# Patient Record
Sex: Female | Born: 1955 | Race: White | Hispanic: No | Marital: Married | Smoking: Never smoker
Health system: Southern US, Community
[De-identification: ages and names within clinical notes are randomized; demographics above are authoritative.]

## PROBLEM LIST (undated history)

## (undated) DIAGNOSIS — N3281 Overactive bladder: Secondary | ICD-10-CM

## (undated) DIAGNOSIS — K862 Cyst of pancreas: Secondary | ICD-10-CM

## (undated) DIAGNOSIS — D239 Other benign neoplasm of skin, unspecified: Secondary | ICD-10-CM

## (undated) DIAGNOSIS — E785 Hyperlipidemia, unspecified: Secondary | ICD-10-CM

## (undated) DIAGNOSIS — L509 Urticaria, unspecified: Secondary | ICD-10-CM

## (undated) DIAGNOSIS — M8580 Other specified disorders of bone density and structure, unspecified site: Secondary | ICD-10-CM

## (undated) DIAGNOSIS — K219 Gastro-esophageal reflux disease without esophagitis: Secondary | ICD-10-CM

## (undated) DIAGNOSIS — R739 Hyperglycemia, unspecified: Secondary | ICD-10-CM

## (undated) DIAGNOSIS — L719 Rosacea, unspecified: Secondary | ICD-10-CM

## (undated) HISTORY — DX: Gastro-esophageal reflux disease without esophagitis: K21.9

## (undated) HISTORY — DX: Hyperglycemia, unspecified: R73.9

## (undated) HISTORY — DX: Rosacea, unspecified: L71.9

## (undated) HISTORY — DX: Other benign neoplasm of skin, unspecified: D23.9

## (undated) HISTORY — PX: OTHER SURGICAL HISTORY: SHX169

## (undated) HISTORY — DX: Urticaria, unspecified: L50.9

## (undated) HISTORY — DX: Hyperlipidemia, unspecified: E78.5

## (undated) HISTORY — DX: Overactive bladder: N32.81

## (undated) HISTORY — DX: Other specified disorders of bone density and structure, unspecified site: M85.80

## (undated) HISTORY — PX: THYROID SURGERY: SHX805

## (undated) HISTORY — DX: Cyst of pancreas: K86.2

---

## 2019-03-14 ENCOUNTER — Ambulatory Visit (INDEPENDENT_AMBULATORY_CARE_PROVIDER_SITE_OTHER): Payer: Medicare Other | Admitting: Gastroenterology

## 2019-03-14 VITALS — BP 108/80 | HR 84 | Temp 98.6°F | Ht 65.0 in | Wt 140.1 lb

## 2019-03-14 DIAGNOSIS — K862 Cyst of pancreas: Secondary | ICD-10-CM

## 2019-03-14 NOTE — Progress Notes (Signed)
HPI :  64 year old female with a history of GERD, hyperlipidemia, pancreatic cyst, referred here by Margaretha Sheffield MD for pancreatic cyst.  The patient underwent an ultrasound of her abdomen for aortic aneurysm screening on November 30.  There was no evidence of an aneurysm however there was a short segment of echogenicity within the lower abdominal aorta with lack of color Doppler signal and was concerning for possible thrombus.  This led to CT angiogram of the abdomen and pelvis on January 25 2019 in [place].  As outlined below, the CT showed that along the deep portion of the uncinate process of the pancreas, a 1.6, by 2.3 x 1.6 cm low attenuating nodular focus was appreciated with a Hounsfield unit of 11.86.  This was suspected to represent a cyst.  The pancreas was otherwise unremarkable without any lymphadenopathy or other concerning findings.  Otherwise there was no evidence of an aneurysm nor partially obstructing mural thrombus.  She did have a ovarian cyst of the left adnexa and was recommended to have a follow-up ultrasound.  Patient states she is otherwise feels quite well.  She denies any history of pancreatitis or pancreas problems personally.  She denies any family history of pancreatic cancer.  Her mother had cirrhosis from history of hepatitis following a blood transfusion.  She states her sister also passed away at age 11 due to "hepatitis ".  She denies any tobacco use history.  She denies any abdominal pains.  No weight loss.  No digestive complaints otherwise.  Reflux is well controlled with Pepcid as needed.  Prior workup: CT chest / abdomen / pelvis 01/25/2019 - 1.6 x 2.3 x 1.6cm nodular focus of the uncinate process , likely represents a cyst.  [place] 01/24/19 -  short segment of echogenicity within the lower abdominal aorta with lack of color Doppler signal and was concerning for possible thrombus  Colonoscopy 09/26/2014 - tortous colon, otherwise normal, no polyps - Dr. Dawna Part   Past Medical History:  Diagnosis Date  . Dysplastic nevus of skin   . GERD (gastroesophageal reflux disease)   . Hyperglycemia   . Hyperlipidemia   . Rosacea   . Urticaria      Past Surgical History:  Procedure Laterality Date  . bartholin cyst removal    . THYROID SURGERY     glossal duct cyst   Family History  Problem Relation Age of Onset  . Cirrhosis Mother   . Hypertension Mother   . Diabetes type II Mother   . Hypothyroidism Mother   . Myasthenia gravis Mother   . Heart attack Father   . Cirrhosis Sister        hepatitis  . Colon cancer Neg Hx   . Liver cancer Neg Hx   . Rectal cancer Neg Hx   . Stomach cancer Neg Hx    Social History   Tobacco Use  . Smoking status: Never Smoker  . Smokeless tobacco: Never Used  Substance Use Topics  . Alcohol use: Yes    Alcohol/week: 1.0 standard drinks    Types: 1 Cans of beer per week    Comment: 1 beer  weekly  . Drug use: Never   Current Outpatient Medications  Medication Sig Dispense Refill  . aspirin EC 81 MG tablet Take 81 mg by mouth daily.    Marland Kitchen atorvastatin (LIPITOR) 20 MG tablet Take 20 mg by mouth daily.    . Cholecalciferol (VITAMIN D) 50 MCG (2000 UT) tablet Take 2,000 Units  by mouth daily.    . folic acid (FOLVITE) Q000111Q MCG tablet Take 400 mcg by mouth daily.    . vitamin B-12 (CYANOCOBALAMIN) 500 MCG tablet Take 500 mcg by mouth every other day.     . famotidine (PEPCID) 20 MG tablet Take 20 mg by mouth as needed.      No current facility-administered medications for this visit.   Allergies  Allergen Reactions  . Penicillins Rash     Review of Systems: All systems reviewed and negative except where noted in HPI.    No results found.  Physical Exam: BP 108/80   Pulse 84   Temp 98.6 F (37 C)   Ht 5\' 5"  (1.651 m)   Wt 140 lb 2 oz (63.6 kg)   BMI 23.32 kg/m  Constitutional: Pleasant,well-developed, female in no acute distress. HEENT: Normocephalic and atraumatic.  Conjunctivae are normal. No scleral icterus. Neck supple.  Cardiovascular: Normal rate, regular rhythm.  Pulmonary/chest: Effort normal and breath sounds normal. No wheezing, rales or rhonchi. Abdominal: Soft, nondistended, nontender. . There are no masses palpable. No hepatomegaly. Extremities: no edema Lymphadenopathy: No cervical adenopathy noted. Neurological: Alert and oriented to person place and time. Skin: Skin is warm and dry. No rashes noted. Psychiatric: Normal mood and affect. Behavior is normal.   ASSESSMENT AND PLAN: 64 year old female here for new patient assessment regarding the following:  Pancreatic cyst / abnormal imaging of the pancreas - incidentally noted 2.3 cm suspected cystic lesion in the uncinate process on recent CT scan.  She is asymptomatic without any family history of pancreatic disease.  We discussed the findings and differential diagnosis.  Given the size of the lesion, I think an EUS may be reasonable at this time per ACG guidelines.  I will discuss her case with my advanced endoscopy partners to determine if they want to proceed directly with EUS plus minus FNA pending findings, or if they would prefer to have MRCP to further characterize this.  I discussed what both of these entailed.  I will get back to her in the next week or so after I can discuss her case with my partners.  If she is deemed a candidate for EUS directly, she is comfortable proceeding with this in the upcoming weeks.  All questions answered, will get back to her shortly.  She agreed.  I spent 45 minutes of time, including in depth chart review, independent review of results as outlined above, communicating results with the patient directly, face-to-face time with the patient, coordinating care, ordering studies and medications as appropriate, and documenting this encounter.    Fitzgerald Cellar, MD [facility]

## 2019-03-14 NOTE — Progress Notes (Signed)
New patient paperwork, Referral from Dannial Monarch, FNP for "Cyst if pancreas"

## 2019-03-14 NOTE — Patient Instructions (Signed)
If you are age 66 or older, your body mass index should be between 23-30. Your Body mass index is 23.32 kg/m. If this is out of the aforementioned range listed, please consider follow up with your Primary Care Provider.  If you are age 30 or younger, your body mass index should be between 19-25. Your Body mass index is 23.32 kg/m. If this is out of the aformentioned range listed, please consider follow up with your Primary Care Provider.   You will be due for a recall colonoscopy in August 2026. We will send you a reminder in the mail when it gets closer to that time.  Thank you for entrusting me with your care and for choosing [facility], Dr. Gilgo Cellar

## 2019-03-15 ENCOUNTER — Other Ambulatory Visit: Payer: Self-pay

## 2019-03-15 ENCOUNTER — Telehealth: Payer: Self-pay | Admitting: Gastroenterology

## 2019-03-15 DIAGNOSIS — K862 Cyst of pancreas: Secondary | ICD-10-CM

## 2019-03-15 NOTE — Telephone Encounter (Signed)
Traci Burns - can you please let the patient know that I spoke with my advanced endoscopy colleagues about her case - EUS versus MRI. They recommend MRI / MRCP first to better evaluate this, she may not need EUS pending findings.  - can you help coordinate MRI pancreas / MRCP for her? If she is willing would prefer to do it in [place], it is much easier to review the imaging and work with our radiologists here if we have further questions. Thanks

## 2019-03-15 NOTE — Telephone Encounter (Signed)
Called patient and scheduled MRI/MRCP at [facility] on 03/25/19 patient to arrive at 12:30pm and be NPO 4 hours before

## 2019-03-25 ENCOUNTER — Other Ambulatory Visit: Payer: Self-pay

## 2019-03-25 ENCOUNTER — Ambulatory Visit (HOSPITAL_COMMUNITY)
Admission: RE | Admit: 2019-03-25 | Discharge: 2019-03-25 | Disposition: A | Discharge status: Home / Self Care | Payer: Medicare Other | Source: Ambulatory Visit | Attending: Gastroenterology | Admitting: Gastroenterology

## 2019-03-25 ENCOUNTER — Other Ambulatory Visit: Payer: Self-pay | Admitting: Gastroenterology

## 2019-03-25 DIAGNOSIS — K862 Cyst of pancreas: Secondary | ICD-10-CM | POA: Diagnosis present

## 2019-03-25 LAB — POCT I-STAT CREATININE: Creatinine, Ser: 0.7 mg/dL (ref 0.44–1.00)

## 2019-03-25 MED ORDER — GADOBUTROL 1 MMOL/ML IV SOLN
7.0000 mL | Freq: Once | INTRAVENOUS | Status: AC | PRN
  Administered 2019-03-25: 7 mL via INTRAVENOUS

## 2019-09-01 ENCOUNTER — Telehealth: Payer: Self-pay | Admitting: Gastroenterology

## 2019-09-01 ENCOUNTER — Other Ambulatory Visit: Payer: Self-pay

## 2019-09-01 DIAGNOSIS — K862 Cyst of pancreas: Secondary | ICD-10-CM

## 2019-09-01 NOTE — Telephone Encounter (Signed)
Patient notified is due for MRCP to follow up pancreatic cyst in August.  I have scheduled her for MRI at [facility] on 09/26/19 at 9:00. She will need to arrive at 8:00 for stat labs and the MRI at 9:00.  She is instructed to be NPO after midnight.

## 2019-09-01 NOTE — Telephone Encounter (Signed)
Patient called to schedule a repeat MRI

## 2019-09-26 ENCOUNTER — Other Ambulatory Visit: Payer: Self-pay | Admitting: Gastroenterology

## 2019-09-26 ENCOUNTER — Ambulatory Visit (HOSPITAL_COMMUNITY)
Admission: RE | Admit: 2019-09-26 | Discharge: 2019-09-26 | Disposition: A | Discharge status: Home / Self Care | Payer: Medicare Other | Source: Ambulatory Visit | Attending: Gastroenterology | Admitting: Gastroenterology

## 2019-09-26 ENCOUNTER — Other Ambulatory Visit: Payer: Self-pay

## 2019-09-26 DIAGNOSIS — K862 Cyst of pancreas: Secondary | ICD-10-CM

## 2019-09-26 MED ORDER — GADOBUTROL 1 MMOL/ML IV SOLN
6.0000 mL | Freq: Once | INTRAVENOUS | Status: AC | PRN
  Administered 2019-09-26: 6 mL via INTRAVENOUS

## 2020-04-11 ENCOUNTER — Ambulatory Visit (INDEPENDENT_AMBULATORY_CARE_PROVIDER_SITE_OTHER): Payer: Medicare Other | Admitting: Gastroenterology

## 2020-04-11 ENCOUNTER — Encounter: Payer: Self-pay | Admitting: Gastroenterology

## 2020-04-11 VITALS — BP 122/64 | HR 68 | Ht 65.0 in | Wt 141.0 lb

## 2020-04-11 DIAGNOSIS — K862 Cyst of pancreas: Secondary | ICD-10-CM | POA: Diagnosis not present

## 2020-04-11 DIAGNOSIS — Z1211 Encounter for screening for malignant neoplasm of colon: Secondary | ICD-10-CM | POA: Diagnosis not present

## 2020-04-11 NOTE — Patient Instructions (Signed)
If you are age 68 or older, your body mass index should be between 23-30. Your Body mass index is 23.46 kg/m. If this is out of the aforementioned range listed, please consider follow up with your Primary Care Provider.  If you are age 1 or younger, your body mass index should be between 19-25. Your Body mass index is 23.46 kg/m. If this is out of the aformentioned range listed, please consider follow up with your Primary Care Provider.  You will be contacted to schedule your MRCP at [facility].  Their phone number is [phone].    You will be due for a recall colonoscopy in 09-2024. We will send you a reminder in the mail when it gets closer to that time.  Thank you for entrusting me with your care and for choosing [facility], Dr. George Cellar

## 2020-04-11 NOTE — Progress Notes (Signed)
HPI :  65 year old female here for a follow up visit for pancreatic cyst.   The patient underwent an ultrasound of her abdomen for aortic aneurysm screening on In November 2020.  There was no evidence of an aneurysm however there was a short segment of echogenicity within the lower abdominal aorta with lack of color Doppler signal and was concerning for possible thrombus.  This led to CT angiogram of the abdomen and pelvis on January 25 2019 in [place]. The CT showed that along the deep portion of the uncinate process of the pancreas, a 1.6, by 2.3 x 1.6 cm low attenuating nodular focus was appreciated with a Hounsfield unit of 11.86.  This was suspected to represent a cyst.  The pancreas was otherwise unremarkable without any lymphadenopathy or other concerning findings.  Otherwise there was no evidence of an aneurysm nor partially obstructing mural thrombus.    Since she was seen here she has undergone 2 MRCP's over the past year.  I have discussed her case with my advanced endoscopy colleagues in the had recommended surveillance MRI over EUS with possible FNA given the appearance of the lesion on MRI and stability over time.  She continues to feel well.  She has no abdominal symptoms of bother her.  No pain.  Weight is stable.  She is eating well, no postprandial symptoms.  She again denies any family history of pancreatic cancer or pancreatitis.  She denies any tobacco use.  Prior workup: CT chest / abdomen / pelvis 01/25/2019 - 1.6 x 2.3 x 1.6cm nodular focus of the uncinate process , likely represents a cyst.  MRCP 03/25/19 -  IMPRESSION: 1. 2.7 by 0.8 by 2.4 cm serpentine cystic lesion or clustered cystic lesion along the posterior margin of the pancreatic head and uncinate process. Based on configuration the top considerations would be intraductal papillary mucinous neoplasm or serous cystadenoma. Based on the long axis size of the lesion being greater than 2.5 cm, reasonable follow up  at this point might include endoscopic ultrasound with fine-needle aspiration, or follow up pancreatic protocol MRI in 6 months time to assess for change. This recommendation follows ACR consensus guidelines: Management of Incidental Pancreatic Cysts: A White Paper of the ACR Incidental Findings Committee. [place] 2409;[id number]:[id number]. 2. Aortic Atherosclerosis (ICD10-I70.0).  MRCP 09/26/19 - IMPRESSION: 1. Multi cystic lesion in the pancreatic head is unchanged since previous imaging. Size greater than 2.5 cm, at this size would suggest another six-month follow-up to assess for stability. Differential is unchanged with serous cystadenoma or small IPMN but without high-risk features of main duct dilation. 2. No significant fat or iron in the liver. Small hepatic cysts are unchanged. 3. Atheromatous plaque in the abdominal aorta.   Colonoscopy 09/26/2014 - tortous colon, otherwise normal, no polyps - Dr. Dawna Part    Past Medical History:  Diagnosis Date  . Dysplastic nevus of skin   . GERD (gastroesophageal reflux disease)   . Hyperglycemia   . Hyperlipidemia   . Pancreatic cyst   . Rosacea   . Urticaria      Past Surgical History:  Procedure Laterality Date  . bartholin cyst removal    . THYROID SURGERY     glossal duct cyst   Family History  Problem Relation Age of Onset  . Cirrhosis Mother   . Hypertension Mother   . Diabetes type II Mother   . Hypothyroidism Mother   . Myasthenia gravis Mother   . Heart attack Father   .  Cirrhosis Sister        hepatitis  . Colon cancer Neg Hx   . Liver cancer Neg Hx   . Rectal cancer Neg Hx   . Stomach cancer Neg Hx    Social History   Tobacco Use  . Smoking status: Never Smoker  . Smokeless tobacco: Never Used  Vaping Use  . Vaping Use: Never used  Substance Use Topics  . Alcohol use: Yes    Alcohol/week: 1.0 standard drink    Types: 1 Cans of beer per week    Comment: 1 beer  weekly  . Drug use: Never    Current Outpatient Medications  Medication Sig Dispense Refill  . aspirin EC 81 MG tablet Take 81 mg by mouth daily.    Marland Kitchen atorvastatin (LIPITOR) 20 MG tablet Take 20 mg by mouth daily.    . Cholecalciferol (VITAMIN D) 50 MCG (2000 UT) tablet Take 2,000 Units by mouth daily.    . famotidine (PEPCID) 20 MG tablet Take 20 mg by mouth as needed.     . folic acid (FOLVITE) 494 MCG tablet Take 400 mcg by mouth daily.    . vitamin B-12 (CYANOCOBALAMIN) 500 MCG tablet Take 500 mcg by mouth every other day.      No current facility-administered medications for this visit.   Allergies  Allergen Reactions  . Penicillins Rash     Review of Systems: All systems reviewed and negative except where noted in HPI.    Physical Exam: BP 122/64   Pulse 68   Ht 5\' 5"  (1.651 m)   Wt 141 lb (64 kg)   SpO2 98%   BMI 23.46 kg/m  Constitutional: Pleasant,well-developed, female in no acute distress. Abdominal: Soft, nondistended, nontender.  There are no masses palpable.  Extremities: no edema Lymphadenopathy: No cervical adenopathy noted. Neurological: Alert and oriented to person place and time. Skin: Skin is warm and dry. No rashes noted. Psychiatric: Normal mood and affect. Behavior is normal.   ASSESSMENT AND PLAN: 65 y/o female here for reassessment of the following issues:  Pancreatic cyst - pancreatic cyst was incidentally noted on CT scan in 2020 during work-up for another issue.  She was referred to see [place] since she has had to MRCP is over the past year showing a stable 2.5 cm cyst in the pancreatic head / uncinate process.  There are no high risk features identified on the MRCP to date.  We discussed that this is more than likely a benign cyst however potentially could grow in size or have precancerous changes.  We discussed options to survey this with MRCP versus EUS.  I have previously discussed this case with my advanced endoscopy colleagues and we have recommended continued  surveillance with MRCP at this time.  I offered her an MRCP now, 6 months from her last exam to reassess this for interval growth.  If it does appear to be growing or have any concerning findings then we would proceed with EUS.  If it otherwise is stable we will continue MRCP surveillance.  Following discussion of this issue she agreed was with the plan, will await MRCP findings with further recommendations.  She agreed  Colon cancer screening - due for next screening colonoscopy 09/2024, recall placed  [place] Cellar, MD [facility]

## 2020-04-19 ENCOUNTER — Telehealth: Payer: Self-pay | Admitting: Gastroenterology

## 2020-04-19 NOTE — Telephone Encounter (Signed)
Spoke with patient, she states that she has not been contacted to schedule MRCP and has tried to call to schedule multiple times but no answer. Advised that I will call to schedule for her, patient okay with any date in March other than 3/2. Patient has been scheduled for a MRCP at [facility] on Saturday, 04/28/20 at 9 AM, arrive at 8:30 AM. NPO 4 hours prior. Patient to enter through main entrance. Patient is aware of appointment and instructions, patient thanked me for getting this scheduled for her and had no concerns at the end of the call.

## 2020-04-19 NOTE — Telephone Encounter (Signed)
Patient called to advise that she has not heard from anyone from [facility] to schedule MRCP

## 2020-04-26 ENCOUNTER — Other Ambulatory Visit: Payer: Self-pay | Admitting: Gastroenterology

## 2020-04-26 DIAGNOSIS — K862 Cyst of pancreas: Secondary | ICD-10-CM

## 2020-04-28 ENCOUNTER — Ambulatory Visit (HOSPITAL_COMMUNITY)
Admission: RE | Admit: 2020-04-28 | Discharge: 2020-04-28 | Disposition: A | Discharge status: Home / Self Care | Payer: Medicare Other | Source: Ambulatory Visit | Attending: Gastroenterology | Admitting: Gastroenterology

## 2020-04-28 ENCOUNTER — Encounter (HOSPITAL_COMMUNITY): Payer: Self-pay

## 2020-04-28 DIAGNOSIS — K862 Cyst of pancreas: Secondary | ICD-10-CM | POA: Diagnosis present

## 2020-04-28 MED ORDER — GADOBUTROL 1 MMOL/ML IV SOLN
6.0000 mL | Freq: Once | INTRAVENOUS | Status: AC | PRN
  Administered 2020-04-28: 6 mL via INTRAVENOUS

## 2020-11-02 ENCOUNTER — Telehealth: Payer: Self-pay

## 2020-11-02 DIAGNOSIS — K862 Cyst of pancreas: Secondary | ICD-10-CM

## 2020-11-02 NOTE — Telephone Encounter (Signed)
-----   Message from Yevette Edwards, RN sent at 05/02/2020  2:47 PM EST ----- Regarding: MRI/MRCP Repeat MRI/MRCP - Cyst of pancreas

## 2020-11-02 NOTE — Telephone Encounter (Signed)
MRI order in epic. Secure staff message sent to radiology schedulers to contact patient to set up her appt.   My chart message sent to patient with reminder.

## 2020-11-16 ENCOUNTER — Telehealth: Payer: Self-pay | Admitting: Gastroenterology

## 2020-11-16 NOTE — Telephone Encounter (Signed)
Inbound call from pt requesting a call back stating that she has been having bronchitis for about 2 weeks. She is concerned because she has an MRI coming up on 11/20/20. Please advise. Thank you.

## 2020-11-16 NOTE — Telephone Encounter (Signed)
Spoke with patient, she states that she had not been well for the last couple of weeks. She had a virtual visit with her PCP who prescribed Azithromycin but that did not help. Pt states that she went to see ENT but they would not see her until she provided a negative COVID test. Pt tested Negative for COVID on Wednesday and was able to see ENT. They diagnosed her with laryngotracheal bronchitis. ENT doctor told patient that this could make her have a flare of reflux. Pt was instructed to follow an anti-reflux diet and he started her on Esomeprazole 20 mg daily before breakfast. Pt is also taking Gaviscon chewable tablets, she takes 2 at night. Patient is also on Benzonatate 200 mg TID PRN and Tussin at night. Advised patient that if she is still experiencing coughing then she will need to reschedule her MRI until she is better because she does have to be still for that imaging. Pt will contact radiology scheduling to reschedule her MRI. Pt's follow up appt with Dr. Havery Moros has been moved to Friday, 12/21/20 at 8:10 am. Pt verbalized understanding of all information and had no concerns at the end of the call.

## 2020-11-20 ENCOUNTER — Ambulatory Visit (HOSPITAL_COMMUNITY): Payer: Medicare Other

## 2020-11-28 ENCOUNTER — Ambulatory Visit: Payer: Medicare Other | Admitting: Gastroenterology

## 2020-12-10 ENCOUNTER — Other Ambulatory Visit: Payer: Self-pay | Admitting: Gastroenterology

## 2020-12-10 DIAGNOSIS — K862 Cyst of pancreas: Secondary | ICD-10-CM

## 2020-12-14 ENCOUNTER — Ambulatory Visit (HOSPITAL_COMMUNITY)
Admission: RE | Admit: 2020-12-14 | Discharge: 2020-12-14 | Disposition: A | Discharge status: Home / Self Care | Payer: Medicare Other | Source: Ambulatory Visit | Attending: Gastroenterology | Admitting: Gastroenterology

## 2020-12-14 ENCOUNTER — Other Ambulatory Visit: Payer: Self-pay

## 2020-12-14 DIAGNOSIS — K862 Cyst of pancreas: Secondary | ICD-10-CM | POA: Diagnosis present

## 2020-12-14 MED ORDER — GADOBUTROL 1 MMOL/ML IV SOLN
6.0000 mL | Freq: Once | INTRAVENOUS | Status: AC | PRN
  Administered 2020-12-14: 6 mL via INTRAVENOUS

## 2020-12-19 ENCOUNTER — Telehealth: Payer: Self-pay | Admitting: Gastroenterology

## 2020-12-19 NOTE — Telephone Encounter (Signed)
Inbound call from pt requesting a call back wanting to know if she needs to keep her appt for 12/21/20. She stated she received her results in [place] and Dr. Havery Moros suggested to see her at the 1st of the year. Please advise. Thank you.

## 2020-12-20 NOTE — Telephone Encounter (Signed)
Left detailed message on patient's mobile vm letting her know that we will keep current appt as scheduled. Advised pt to call back or  send my chart message if she had any questions or concerns. Will send patient a my chart message as well.

## 2020-12-21 ENCOUNTER — Telehealth: Payer: Self-pay | Admitting: Gastroenterology

## 2020-12-21 ENCOUNTER — Encounter: Payer: Self-pay | Admitting: Gastroenterology

## 2020-12-21 ENCOUNTER — Other Ambulatory Visit: Payer: Self-pay

## 2020-12-21 ENCOUNTER — Ambulatory Visit (INDEPENDENT_AMBULATORY_CARE_PROVIDER_SITE_OTHER): Payer: Medicare Other | Admitting: Gastroenterology

## 2020-12-21 VITALS — BP 128/70 | HR 74 | Ht 65.0 in | Wt 139.0 lb

## 2020-12-21 DIAGNOSIS — K862 Cyst of pancreas: Secondary | ICD-10-CM

## 2020-12-21 DIAGNOSIS — K219 Gastro-esophageal reflux disease without esophagitis: Secondary | ICD-10-CM | POA: Diagnosis not present

## 2020-12-21 NOTE — Progress Notes (Signed)
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  I last saw her in February of this year.  Recall that the patient underwent an ultrasound of her abdomen for aortic aneurysm screening on In November 2020.  There was no evidence of an aneurysm however there was a short segment of echogenicity within the lower abdominal aorta with lack of color Doppler signal and was concerning for possible thrombus.  This led to CT angiogram of the abdomen and pelvis on January 25 2019 in [place]. The CT showed that along the deep portion of the uncinate process of the pancreas, a 1.6, by 2.3 x 1.6 cm low attenuating nodular focus was appreciated with a Hounsfield unit of 11.86.  This was suspected to represent a cyst.  The pancreas was otherwise unremarkable without any lymphadenopathy or other concerning findings.  Otherwise there was no evidence of an aneurysm nor partially obstructing mural thrombus.     I had initially discussed her case with my advanced endoscopy colleagues and they recommended MRCP rather than pursuing EUS. Since she was seen here initially she has undergone 4 MRCP's over the past year.  She just had her last exam last week.  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 She continues to feel well.  She has no abdominal symptoms of bother her.  No pain.  Weight is stable.  She is eating well, no postprandial symptoms.  She again denies any family history of pancreatic cancer or pancreatitis.  She denies any tobacco use.  She inquires about her history of reflux.  She does not have much of any pyrosis at baseline, occasional regurgitation.  She had some bronchitis in September and was given some Nexium to take to see if this would help her cough as well some Gaviscon.  She inquires if she should continue this.  She is managing her symptoms with diet, denies much of  any symptoms of reflux or heartburn at this time.  She was on Pepcid in the past, has not had it in some time.    Prior workup: CT chest / abdomen / pelvis 01/25/2019 - 1.6 x 2.3 x 1.6cm nodular focus of the uncinate process , likely represents a cyst.   MRCP 03/25/19 -  IMPRESSION: 1. 2.7 by 0.8 by 2.4 cm serpentine cystic lesion or clustered cystic lesion along the posterior margin of the pancreatic head and uncinate process. Based on configuration the top considerations would be intraductal papillary mucinous neoplasm or serous cystadenoma. Based on the long axis size of the lesion being greater than 2.5 cm, reasonable follow up at this point might include endoscopic ultrasound with fine-needle aspiration, or follow up pancreatic protocol MRI in 6 months time to assess for change. This recommendation follows ACR consensus guidelines: Management of Incidental Pancreatic Cysts: A White Paper of the ACR Incidental Findings Committee. [place] 3785;88:502-774. 2. Aortic Atherosclerosis (ICD10-I70.0).   MRCP 09/26/19 - IMPRESSION: 1. Multi cystic lesion in the pancreatic head is unchanged since previous imaging. Size greater than 2.5 cm, at this size would suggest another six-month follow-up to assess for stability. Differential is unchanged with serous cystadenoma or small IPMN but without high-risk features of main duct dilation. 2. No significant fat or iron in the liver. Small hepatic cysts are unchanged. 3. Atheromatous plaque in the abdominal aorta.     MRCP 04/28/20 -  IMPRESSION: Unchanged size in the multi-cystic pancreatic lesion located behind the uncinate process, which is without high risk imaging features now with 1 year of stability by imaging. Differential diagnosis for this lesion remains the same with side branch intraductal papillary mucinous neoplasm (IPMN) or serous cystadenoma being the primary considerations. Recommend follow up pre and post contrast  MRI/MRCP or pancreatic protocol CT in 6 months. This recommendation follows ACR consensus guidelines: Management of Incidental Pancreatic Cysts: A White Paper of the ACR Incidental Findings Committee. [place] 1829;93:716-967.  MRCP 12/14/20 - IMPRESSION: Unchanged flattened, thinly septated cystic lesion of the posterior pancreatic head and uncinate measuring 2.5 x 0.8 cm. As on prior examination, primary differential considerations remain IPMN and serous cystic neoplasm. Recommend follow up pre and post contrast MRI/MRCP or pancreatic protocol CT in 6 months. EUS/FNA can be considered for definitive diagnosis. This recommendation follows ACR consensus guidelines: Management of Incidental Pancreatic Cysts: a White Paper of the ACR Incidental Findings Committee. [place] 8938;10:175-102.  Colonoscopy 09/26/2014 - tortous colon, otherwise normal, no polyps - Dr. Posey Pronto, [facility]      Past Medical History:  Diagnosis Date   Dysplastic nevus of skin    GERD (gastroesophageal reflux disease)    Hyperglycemia    Hyperlipidemia    Pancreatic cyst    Rosacea    Urticaria      Past Surgical History:  Procedure Laterality Date   bartholin cyst removal     THYROID SURGERY     glossal duct cyst   Family History  Problem Relation Age of Onset   Cirrhosis Mother    Hypertension Mother    Diabetes type II Mother    Hypothyroidism Mother    Myasthenia gravis Mother    Heart attack Father    Cirrhosis Sister        hepatitis   Colon cancer Neg Hx    Liver cancer Neg Hx    Rectal cancer Neg Hx    Stomach cancer Neg Hx    Social History   Tobacco Use   Smoking status: Never   Smokeless tobacco: Never  Vaping Use   Vaping Use: Never used  Substance Use Topics   Alcohol use: Yes    Alcohol/week: 1.0 standard drink    Types: 1 Cans of beer per week    Comment: 1 beer  weekly   Drug use: Never   Current Outpatient Medications  Medication Sig Dispense Refill    aspirin EC 81 MG tablet Take 81 mg by mouth daily.     atorvastatin (LIPITOR) 20 MG tablet Take 20 mg by mouth daily.     Calcium Carb-Cholecalciferol (CALCIUM 500 + D3 PO) Take 1 capsule by mouth daily.     famotidine (PEPCID) 20 MG tablet Take 20 mg by mouth as needed.      folic acid (FOLVITE) 585 MCG tablet Take 400 mcg by mouth daily.     vitamin B-12 (CYANOCOBALAMIN) 500 MCG tablet Take 500 mcg by mouth every other day.      VITAMIN D PO Take 400 Units by mouth daily.     No current facility-administered medications for this visit.   Allergies  Allergen Reactions   Penicillins Rash     Review of Systems: All systems reviewed and negative except where noted in HPI.    MR 3D Recon At Scanner  Result Date: 12/17/2020 CLINICAL DATA:  Follow-up pancreatic cyst EXAM: MRI ABDOMEN WITHOUT AND WITH CONTRAST (INCLUDING MRCP)  TECHNIQUE: Multiplanar multisequence MR imaging of the abdomen was performed both before and after the administration of intravenous contrast. Heavily T2-weighted images of the biliary and pancreatic ducts were obtained, and three-dimensional MRCP images were rendered by post processing. CONTRAST:  32mL GADAVIST GADOBUTROL 1 MMOL/ML IV SOLN COMPARISON:  04/28/2020, 09/26/2019, 03/25/2019 FINDINGS: Lower chest: No acute findings. Hepatobiliary: No mass or other parenchymal abnormality identified. No gallstones. No biliary ductal dilatation. Pancreas: Unchanged flattened, thinly septated cystic lesion of the posterior pancreatic head and uncinate measuring 2.5 x 0.8 cm (series 3, image 25). No solid mass, inflammatory changes, or other parenchymal abnormality identified. No pancreatic ductal dilatation. Spleen:  Within normal limits in size and appearance. Adrenals/Urinary Tract: No masses identified. No evidence of hydronephrosis. Stomach/Bowel: Visualized portions within the abdomen are unremarkable. Vascular/Lymphatic: No pathologically enlarged lymph nodes identified. No  abdominal aortic aneurysm demonstrated. Other:  None. Musculoskeletal: No suspicious bone lesions identified. IMPRESSION: Unchanged flattened, thinly septated cystic lesion of the posterior pancreatic head and uncinate measuring 2.5 x 0.8 cm. As on prior examination, primary differential considerations remain IPMN and serous cystic neoplasm. Recommend follow up pre and post contrast MRI/MRCP or pancreatic protocol CT in 6 months. EUS/FNA can be considered for definitive diagnosis. This recommendation follows ACR consensus guidelines: Management of Incidental Pancreatic Cysts: a White Paper of the ACR Incidental Findings Committee. [place] 4010;27:253-664. Electronically Signed   By: Delanna Ahmadi M.D.   On: 12/17/2020 10:10   MR ABDOMEN MRCP W WO CONTAST  Result Date: 12/17/2020 CLINICAL DATA:  Follow-up pancreatic cyst EXAM: MRI ABDOMEN WITHOUT AND WITH CONTRAST (INCLUDING MRCP) TECHNIQUE: Multiplanar multisequence MR imaging of the abdomen was performed both before and after the administration of intravenous contrast. Heavily T2-weighted images of the biliary and pancreatic ducts were obtained, and three-dimensional MRCP images were rendered by post processing. CONTRAST:  37mL GADAVIST GADOBUTROL 1 MMOL/ML IV SOLN COMPARISON:  04/28/2020, 09/26/2019, 03/25/2019 FINDINGS: Lower chest: No acute findings. Hepatobiliary: No mass or other parenchymal abnormality identified. No gallstones. No biliary ductal dilatation. Pancreas: Unchanged flattened, thinly septated cystic lesion of the posterior pancreatic head and uncinate measuring 2.5 x 0.8 cm (series 3, image 25). No solid mass, inflammatory changes, or other parenchymal abnormality identified. No pancreatic ductal dilatation. Spleen:  Within normal limits in size and appearance. Adrenals/Urinary Tract: No masses identified. No evidence of hydronephrosis. Stomach/Bowel: Visualized portions within the abdomen are unremarkable. Vascular/Lymphatic: No  pathologically enlarged lymph nodes identified. No abdominal aortic aneurysm demonstrated. Other:  None. Musculoskeletal: No suspicious bone lesions identified. IMPRESSION: Unchanged flattened, thinly septated cystic lesion of the posterior pancreatic head and uncinate measuring 2.5 x 0.8 cm. As on prior examination, primary differential considerations remain IPMN and serous cystic neoplasm. Recommend follow up pre and post contrast MRI/MRCP or pancreatic protocol CT in 6 months. EUS/FNA can be considered for definitive diagnosis. This recommendation follows ACR consensus guidelines: Management of Incidental Pancreatic Cysts: a White Paper of the ACR Incidental Findings Committee. [place] 4034;74:259-563. Electronically Signed   By: Delanna Ahmadi M.D.   On: 12/17/2020 10:10    Lab Results  Component Value Date   CREATININE 0.70 03/25/2019     No results found for: ALT, AST, GGT, ALKPHOS, BILITOT     Physical Exam: BP 128/70   Pulse 74   Ht 5\' 5"  (1.651 m)   Wt 139 lb (63 kg)   BMI 23.13 kg/m  Constitutional: Pleasant,well-developed, female in no acute distress. Neurological: Alert and oriented to  person place and time. Psychiatric: Normal mood and affect. Behavior is normal.   ASSESSMENT AND PLAN: 65 year old female here for reassessment of following:  Pancreatic cyst GERD  I reviewed her imaging studies with her over the past few years.  She has a stable pancreatic cyst measuring upwards of 2.5 cm, stable in size over the past few years without any parenchymal abnormalities and no pancreatic ductal dilation, which is reassuring.  Radiology is recommending another MRCP in 6 months versus EUS.  I had discussed this case with my advanced endoscopy colleagues in the past and I again spoke with them after our visit today about her case.  Our practice generally follows the AGA guidelines of pancreatic cystic lesions.  They reserve EUS for lesions greater than 3 cm or those that are  with high risk pathology.  Discussed with the patient that there are multiple guidelines on surveillance of pancreatic cysts and they can vary a bit on this topic, with other guidelines recommend consideration for EUS at cysts greater than 1.5 cm. However I think EUS is likely to be low yield for her given stability on MRCP over a few years.  In fact, AGA guidelines recommend next surveillance could be done in 2 years for this issue with MRCP.  I think it is reasonable to extend her surveillance with MRCP at this time in the absence of any symptoms or high risk features on her last exam.  If she is anxious about extending this to 2 years I think we can at least extend this out to 1 year.  We will relay recommendations to her.  We will continue to hold off on EUS given stability over time as she is asymptomatic if she is otherwise comfortable with this.  She understands there is a risk for enlargement of pancreatic cyst over time and will need surveillance for this 1 way or the other.  Otherwise, reflux is mild and intermittent and easily managed with over-the-counter regimens.  I do not think she needs any further PPI.  She can use Pepcid as needed or Gaviscon as needed.  She agrees  Plan: - recommend repeat MRCP in 1-2 years, holding off on EUS for now as above. Discussion as above - continue pepcid / gaviscon PRN for reflux - follow up in 1 year for reassessment in the office.  Jolly Mango, MD [facility]

## 2020-12-21 NOTE — Telephone Encounter (Signed)
Jan can you help relay this to the patient:  I spoke with my advanced endoscopy colleagues about this patient's case after she left the office.  We generally follow the AGA guidelines and pancreatic cystic lesions.  They are not recommending an EUS at this time given she has had stable pancreatic cyst in the past for MRCP's over a few years.  In fact this guideline recommends extending her surveillance MRCP to 2 years.  If she is not comfortable with that based on our discussion today we can do it perhaps 1 year but I do not think she needs EUS now and I think MRCP in another 6 months is rather aggressive and costly.  I think MRCP 1 to 2 years is reasonable.  Can you please let her know and let me know what she thinks?  Thanks

## 2020-12-21 NOTE — Patient Instructions (Addendum)
If you are age 67 or older, your body mass index should be between 23-30. Your Body mass index is 23.13 kg/m. If this is out of the aforementioned range listed, please consider follow up with your Primary Care Provider.  If you are age 43 or younger, your body mass index should be between 19-25. Your Body mass index is 23.13 kg/m. If this is out of the aformentioned range listed, please consider follow up with your Primary Care Provider.   ________________________________________________________  The [facility] providers would like to encourage you to use [facility] to communicate with providers for non-urgent requests or questions.  Due to long hold times on the telephone, sending your provider a message by [facility] may be a faster and more efficient way to get a response.  Please allow 48 business hours for a response.  Please remember that this is for non-urgent requests.  _______________________________________________________  Traci Burns will be due for a colonoscopy in 09-2024. We will send you a reminder when it is time to schedule.  Continue Pepcid as needed.  Thank you for entrusting me with your care and for choosing [facility], Dr. [place] Cellar

## 2020-12-24 NOTE — Telephone Encounter (Signed)
Called and spoke to patient. Relayed results and recommendations. She would like to have another MRCP in 1 year. I told her we would contact her about the 1st of October next year to schedule MRCP for later that month. Patient was in agreement with plan. Reminder placed

## 2021-03-24 IMAGING — MR MR ABDOMEN WO/W CM MRCP
17 of 20 series · 40 of 48 positions shown · IV contrast (gadavist)
Comparison: MRI abdomen March 25, 2019 and September 26, 2019

CLINICAL DATA: Follow-up cystic pancreatic lesion.

EXAM:
MRI ABDOMEN WITHOUT AND WITH CONTRAST (INCLUDING MRCP)
TECHNIQUE: Multiplanar multisequence MR imaging of the abdomen was performed
both before and after the administration of intravenous contrast.
Heavily T2-weighted images of the biliary and pancreatic ducts were
obtained, and three-dimensional MRCP images were rendered by post
processing.
CONTRAST:  6mL GADAVIST GADOBUTROL 1 MMOL/ML IV SOLN

[Series 3: T2 fat-sat · axial · 6.0mm · 1.14mm/px · 1 of 38 slices shown]
[im 1/38]
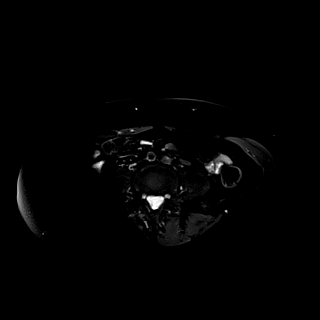

[Series 5: T2 · coronal · 6.0mm · 1.48mm/px · 1 of 30 slices shown (1 of 2)]
[im 1/30]
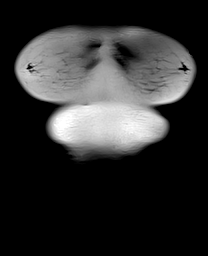

[Series 6: DWI · axial · 6.0mm · 1.36mm/px · z∈[-124,+142]mm · 3 of 76 slices shown (1 of 2)]
[im 1/76]
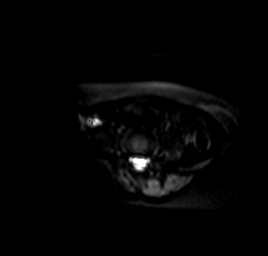
[im 38/76]
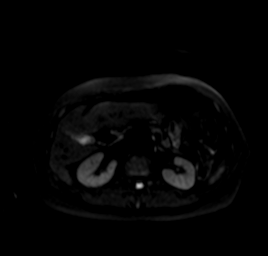
[im 76/76]
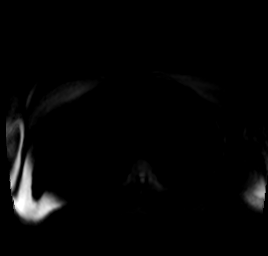

[Series 7: DWI · axial · 6.0mm · 1.36mm/px · 1 of 38 slices shown (2 of 2)]
[im 1/38]
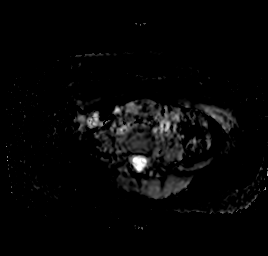

[Series 9: T1 · axial · 3.0mm · 1.12mm/px · z∈[-147,+114]mm · 3 of 88 slices shown (1 of 2)]
[im 1/88]
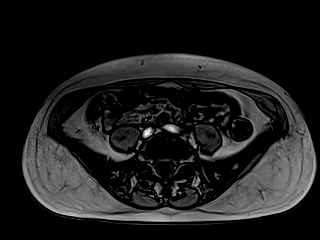
[im 44/88]
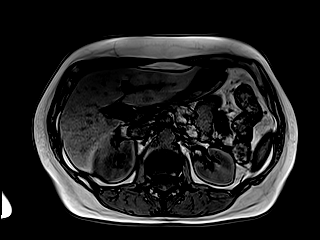
[im 88/88]
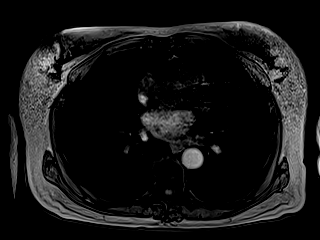

[Series 10: T1 · axial · 3.0mm · 1.12mm/px · z∈[-147,+114]mm · 3 of 88 slices shown (2 of 2)]
[im 1/88]
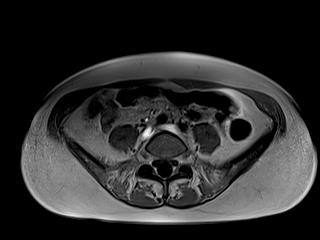
[im 44/88]
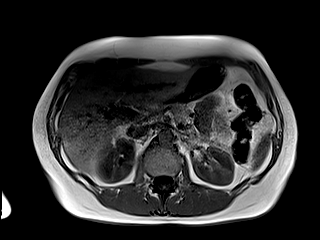
[im 88/88]
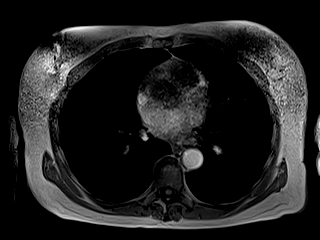

[Series 11: cor obl thk · sagittal · 50.0mm · 0.78mm/px · 1 of 9 slices shown]
[im 1/9]
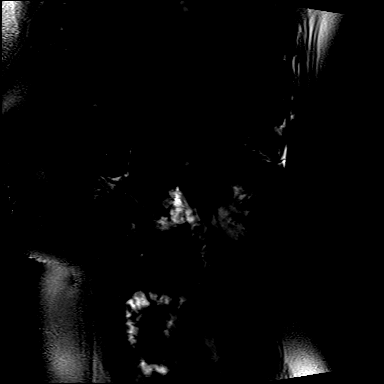

[Series 14: cor_3d_spc_trig · coronal · 1.0mm · 0.49mm/px · 3 of 72 slices shown]
[im 1/72]
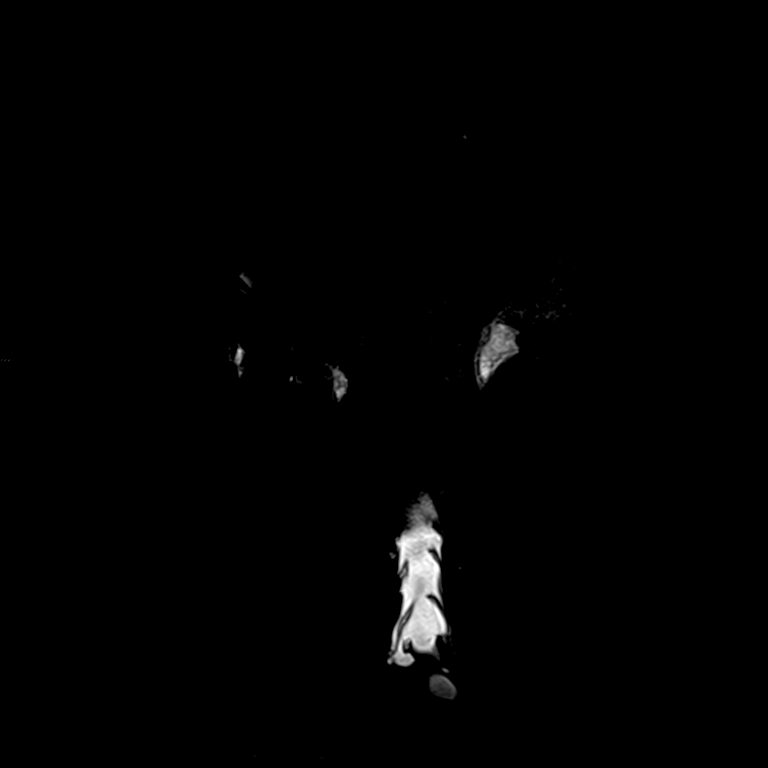
[im 36/72]
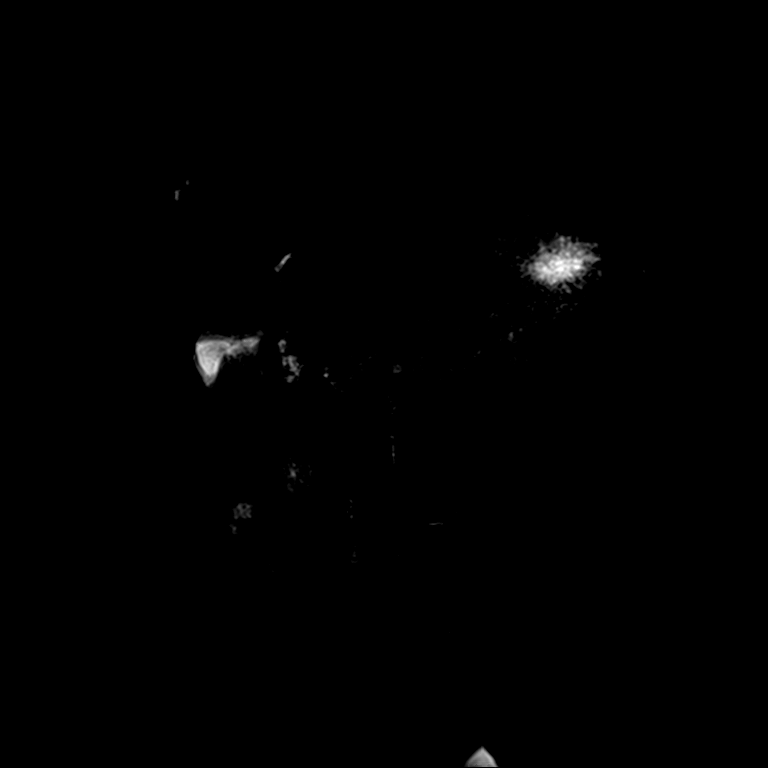
[im 72/72]
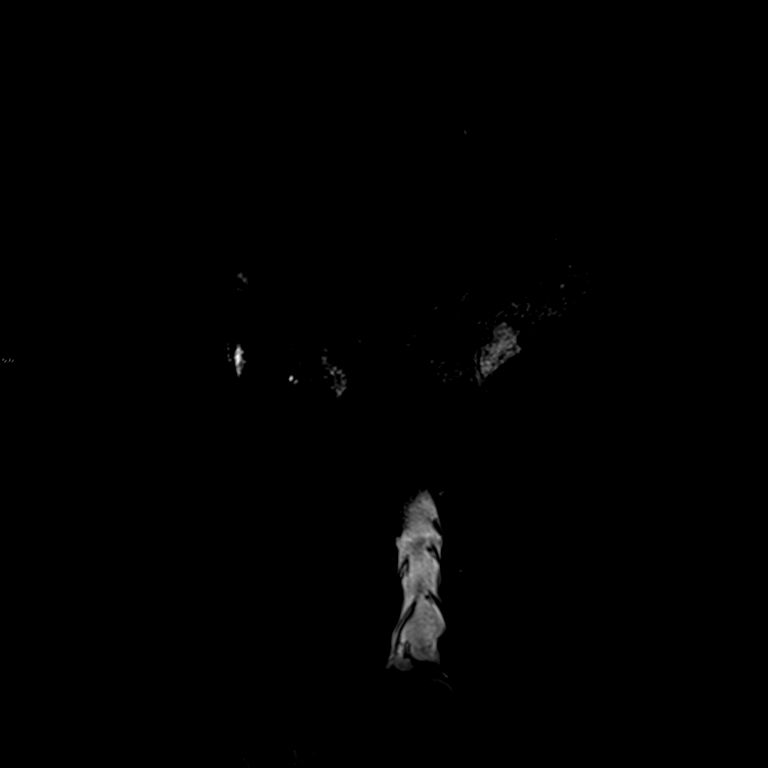

[Series 16: T2 · axial · 6.0mm · 1.41mm/px · 1 of 38 slices shown (2 of 2)]
[im 1/38]
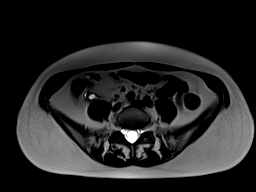

[Series 18: T1 dynamic · axial · 3.0mm · 1.12mm/px · z∈[-147,+114]mm · 3 of 88 slices shown (1 of 6)]
[im 1/88]
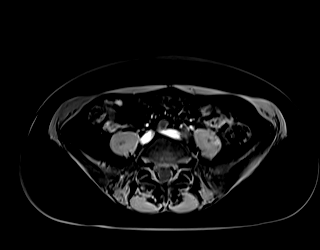
[im 44/88]
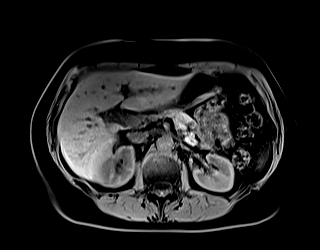
[im 88/88]
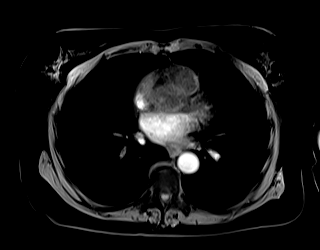

[Series 21: T1 dynamic · axial · 3.0mm · 1.12mm/px · z∈[-147,+114]mm · 3 of 88 slices shown (2 of 6)]
[im 1/88]
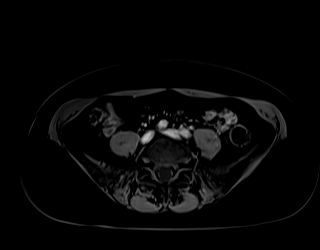
[im 44/88]
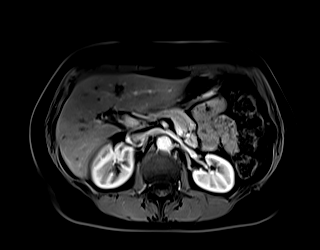
[im 88/88]
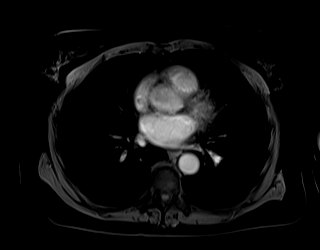

[Series 23: T1 dynamic · axial · 3.0mm · 1.12mm/px · z∈[-147,+114]mm · 3 of 88 slices shown (3 of 6)]
[im 1/88]
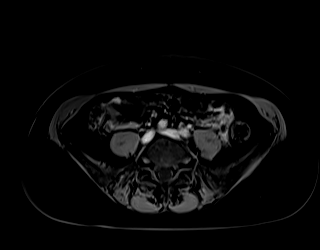
[im 44/88]
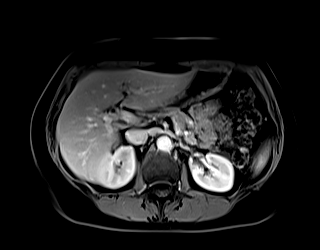
[im 88/88]
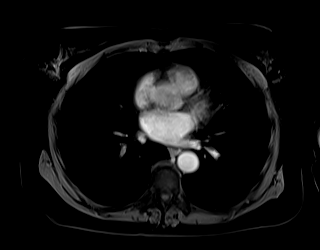

[Series 25: T1 dynamic · axial · 3.0mm · 1.12mm/px · z∈[-147,+114]mm · 3 of 88 slices shown (4 of 6)]
[im 1/88]
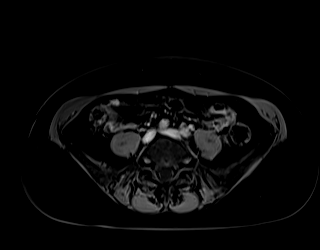
[im 44/88]
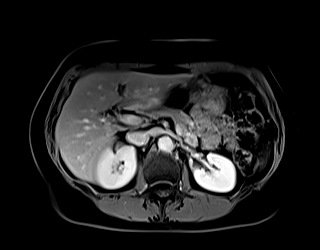
[im 88/88]
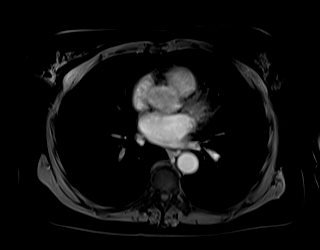

[Series 27: T1 dynamic · coronal · 4.0mm · 1.19mm/px · 2 of 56 slices shown (5 of 6)]
[im 1/56]
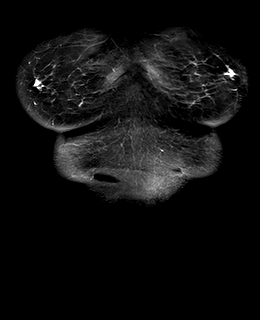
[im 56/56]
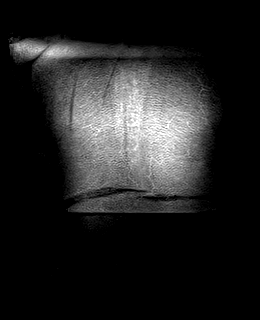

[Series 29: T1 dynamic · axial · 3.0mm · 1.12mm/px · z∈[-147,+114]mm · 3 of 88 slices shown (6 of 6)]
[im 1/88]
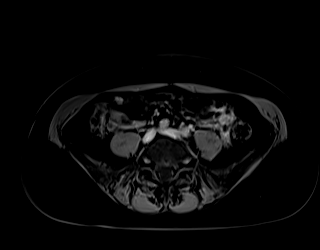
[im 44/88]
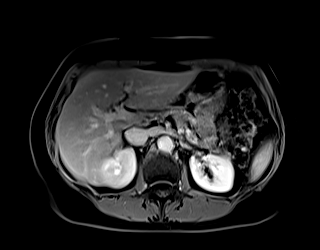
[im 88/88]
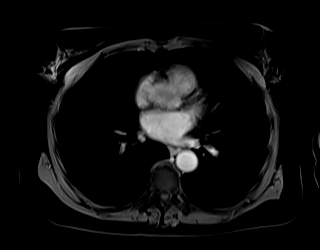

[Series 100: sub_(id) · axial · 3.0mm · 1.12mm/px · z∈[-147,+114]mm · 3 of 88 slices shown (1 of 2)]
[im 1/88]
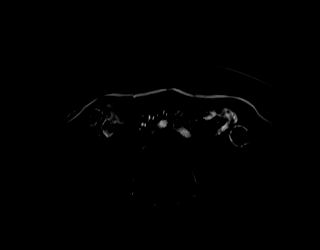
[im 44/88]
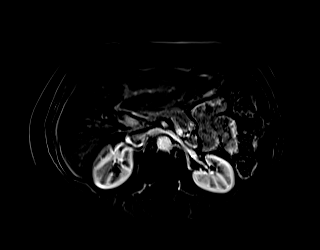
[im 88/88]
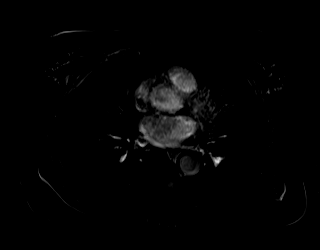

[Series 101: sub_(id) · axial · 3.0mm · 1.12mm/px · z∈[-147,+114]mm · 3 of 88 slices shown (2 of 2)]
[im 1/88]
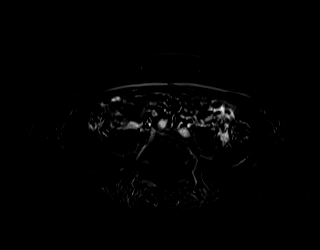
[im 44/88]
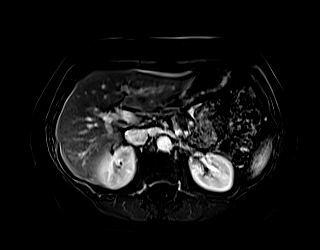
[im 88/88]
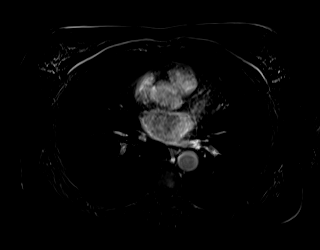

[40 of 48 positions shown; findings below may reference images not displayed]

FINDINGS: Lower chest: Unremarkable

Hepatobiliary: No hepatic steatosis. Small hepatic cysts unchanged.
Gallbladder is unremarkable. No biliary ductal dilation.

Pancreas: Unchanged size in the multi-cystic pancreatic lesion
located behind the uncinate process without associated main duct
dilation which measures 2.5 x 2.3 x 0.8 cm on image [DATE] and image
21/16. There is no suspicious enhancing nodularity or definite
septal enhancement on subtraction sequences.

Spleen:  Within normal limits in size and appearance.

Adrenals/Urinary Tract: No masses identified. No evidence of
hydronephrosis.

Stomach/Bowel: Visualized portions within the abdomen are
unremarkable.

Vascular/Lymphatic: No pathologically enlarged lymph nodes
identified. No abdominal aortic aneurysm demonstrated.

Other:  None.

Musculoskeletal: No suspicious bone lesions identified.
IMPRESSION: Unchanged size in the multi-cystic pancreatic lesion located behind
the uncinate process, which is without high risk imaging features
now with 1 year of stability by imaging. Differential diagnosis for
this lesion remains the same with side branch intraductal papillary
mucinous neoplasm (IPMN) or serous cystadenoma being the primary
considerations. Recommend follow up pre and post contrast MRI/MRCP
or pancreatic protocol CT in 6 months. This recommendation follows
ACR consensus guidelines: Management of Incidental Pancreatic Cysts:
A White Paper of the ACR Incidental Findings Committee. [HOSPITAL] [id number];[DATE].

## 2021-08-13 ENCOUNTER — Telehealth: Payer: Self-pay | Admitting: Gastroenterology

## 2021-08-13 NOTE — Telephone Encounter (Signed)
I reviewed her labs.  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 I'd recommend she see an allergist / immunologist for this issue, she does not need to see me specifically for this, but defer to her PCP if they want her to see immunologist or hematologist. Thanks

## 2021-08-13 NOTE — Telephone Encounter (Signed)
Lm on vm for patient to return call 

## 2021-08-13 NOTE — Telephone Encounter (Signed)
Called and spoke with patient. She reports that her PCP recently did labs and told her that her protein was low. Pt states that her PCP wanted to refer her to a hematologist due to low Globulin level that was noted. Pt wanted your opinion on wether or not she needs referral to Hematology. I have printed the recent labs and placed in your IN box for review. Please advise if you have any further recommendations. Thanks

## 2021-08-14 NOTE — Telephone Encounter (Signed)
Pt returned call. We have reviewed Dr. Doyne Keel recommendations as outlined below. Pt will contact her PCP for referral. Pt had no concerns at the end of the call.

## 2021-11-28 ENCOUNTER — Ambulatory Visit (INDEPENDENT_AMBULATORY_CARE_PROVIDER_SITE_OTHER): Payer: Medicare Other | Admitting: Gastroenterology

## 2021-11-28 ENCOUNTER — Encounter: Payer: Self-pay | Admitting: Gastroenterology

## 2021-11-28 VITALS — BP 126/76 | HR 75 | Ht 65.0 in | Wt 128.0 lb

## 2021-11-28 DIAGNOSIS — K5900 Constipation, unspecified: Secondary | ICD-10-CM

## 2021-11-28 DIAGNOSIS — K862 Cyst of pancreas: Secondary | ICD-10-CM

## 2021-11-28 DIAGNOSIS — K219 Gastro-esophageal reflux disease without esophagitis: Secondary | ICD-10-CM

## 2021-11-28 NOTE — Patient Instructions (Signed)
If you are age 68 or older, your body mass index should be between 23-30. Your Body mass index is 21.3 kg/m. If this is out of the aforementioned range listed, please consider follow up with your Primary Care Provider.  If you are age 75 or younger, your body mass index should be between 19-25. Your Body mass index is 21.3 kg/m. If this is out of the aformentioned range listed, please consider follow up with your Primary Care Provider.   ________________________________________________________   Traci Burns will be contacted by [place] in the next 2 days to arrange an MRCP.  The number on your caller ID will be [phone], please answer when they call.  If you have not heard from them in 2 days please call [phone] to schedule.     You have been scheduled for an MRCP at [facility], 1st floor, Radiology. Your appointment is scheduled on _______________________ at __________________. Please arrive 15 minutes prior to your appointment time for registration purposes. Please make certain not to have anything to eat or drink ______ hours prior to your test. In addition, if you have any metal in your body, have a pacemaker or defibrillator, please be sure to let your ordering physician know. This test typically takes 45 minutes to 1 hour to complete. Should you need to reschedule, please call [phone].   Please purchase the following medications over the counter and take as directed: Miralax: Take 17 g once daily as needed. Titrate up or down as needed  Decrease Protonix to 20 mg once daily.   Thank you for entrusting me with your care and for choosing [facility] Dr. [place] Cellar

## 2021-11-28 NOTE — Progress Notes (Signed)
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  Recall that the patient underwent an ultrasound of her abdomen for aortic aneurysm screening on November 2020.  There was no evidence of an aneurysm however there was a short segment of echogenicity within the lower abdominal aorta with lack of color Doppler signal and was concerning for possible thrombus.  This led to CT angiogram of the abdomen and pelvis on January 25 2019 in [place]. The CT showed that along the deep portion of the uncinate process of the pancreas, a 1.6, by 2.3 x 1.6 cm low attenuating nodular focus was appreciated with a Hounsfield unit of 11.86.  This was suspected to represent a cyst.  The pancreas was otherwise unremarkable without any lymphadenopathy or other concerning findings.  Otherwise there was no evidence of an aneurysm nor partially obstructing mural thrombus.  Since she was seen here initially she has undergone multiple MRCPs over recent years. 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  I had discussed her case with my advanced endoscopy colleagues after her last visit who recommended continued surveillance with MRCP and no need for EUS at this time.  She has no family history of pancreatic cancer.  She has no unintentional weight loss or abdominal pain that is new that is bothering her.  She is otherwise feeling well.  No history of pancreatitis.  No tobacco use.  She does have an overactive bladder for which she has been placed on oxybutynin.  She she is also seeing urology and undergoing pelvic floor PT and bladder training exercises which have helped.  She states the oxybutynin has been leading to constipation.  She had a few episodes of constipation in recent months.  Has been told to take MiraLAX but has questions about the regimen.  She had a colonoscopy in 2016  in [place] which was normal.  She does have a history of reflux historically.  Occasional pyrosis and regurgitation.  She has been on Nexium remotely which worked for her.  She has since come off PPI, then had recurrence of symptoms with water brash and pyrosis and regurgitation.  We had previously discussed trying Pepcid but she did not really try it.  She was given Protonix 40 mg daily by her primary care and has been on that for the past 3 days and feeling better.  She denies any dysphagia.  She has been seen by ENT in the past, last September, was told she had reflux causing some of her symptoms.  She is otherwise stating she is eating very healthy, has improved her diet, has lost a few pounds because of this.    Prior workup: CT chest / abdomen / pelvis 01/25/2019 - 1.6 x 2.3 x 1.6cm nodular focus of the uncinate process , likely represents a cyst.   MRCP 03/25/19 -  IMPRESSION: 1. 2.7 by 0.8 by 2.4 cm serpentine cystic lesion or clustered cystic lesion along the posterior margin of the pancreatic head and uncinate process. Based on configuration the top considerations would be intraductal papillary mucinous neoplasm or serous cystadenoma. Based on the long axis size of the lesion being greater than 2.5 cm, reasonable follow up at this point might include endoscopic ultrasound with fine-needle aspiration, or follow up pancreatic protocol MRI in 6 months time to assess for change. This recommendation follows ACR consensus guidelines: Management  of Incidental Pancreatic Cysts: A White Paper of the ACR Incidental Findings Committee. [place] 2130;86:578-469. 2. Aortic Atherosclerosis (ICD10-I70.0).   MRCP 09/26/19 - IMPRESSION: 1. Multi cystic lesion in the pancreatic head is unchanged since previous imaging. Size greater than 2.5 cm, at this size would suggest another six-month follow-up to assess for stability. Differential is unchanged with serous cystadenoma or small IPMN  but without high-risk features of main duct dilation. 2. No significant fat or iron in the liver. Small hepatic cysts are unchanged. 3. Atheromatous plaque in the abdominal aorta.     MRCP 04/28/20 - IMPRESSION: Unchanged size in the multi-cystic pancreatic lesion located behind the uncinate process, which is without high risk imaging features now with 1 year of stability by imaging. Differential diagnosis for this lesion remains the same with side branch intraductal papillary mucinous neoplasm (IPMN) or serous cystadenoma being the primary considerations. Recommend follow up pre and post contrast MRI/MRCP or pancreatic protocol CT in 6 months. This recommendation follows ACR consensus guidelines: Management of Incidental Pancreatic Cysts: A White Paper of the ACR Incidental Findings Committee. [place] 6295;28:413-244.   MRCP 12/14/20 - IMPRESSION: Unchanged flattened, thinly septated cystic lesion of the posterior pancreatic head and uncinate measuring 2.5 x 0.8 cm. As on prior examination, primary differential considerations remain IPMN and serous cystic neoplasm. Recommend follow up pre and post contrast MRI/MRCP or pancreatic protocol CT in 6 months. EUS/FNA can be considered for definitive diagnosis. This recommendation follows ACR consensus guidelines: Management of Incidental Pancreatic Cysts: a White Paper of the ACR Incidental Findings Committee. [place] 0102;72:536-644.   Colonoscopy 09/26/2014 - tortous colon, otherwise normal, no polyps - Dr. Posey Pronto, Angelina Sheriff    Past Medical History:  Diagnosis Date   Dysplastic nevus of skin    GERD (gastroesophageal reflux disease)    Hyperglycemia    Hyperlipidemia    Overactive bladder    Pancreatic cyst    Rosacea    Urticaria      Past Surgical History:  Procedure Laterality Date   bartholin cyst removal     THYROID SURGERY     glossal duct cyst   Family History  Problem Relation Age of Onset    Cirrhosis Mother    Hypertension Mother    Diabetes type II Mother    Hypothyroidism Mother    Myasthenia gravis Mother    Heart attack Father    Cirrhosis Sister        hepatitis   Colon cancer Neg Hx    Liver cancer Neg Hx    Rectal cancer Neg Hx    Stomach cancer Neg Hx    Social History   Tobacco Use   Smoking status: Never   Smokeless tobacco: Never  Vaping Use   Vaping Use: Never used  Substance Use Topics   Alcohol use: Not Currently    Alcohol/week: 1.0 standard drink of alcohol    Types: 1 Cans of beer per week    Comment: 1 beer  weekly   Drug use: Never   Current Outpatient Medications  Medication Sig Dispense Refill   aspirin EC 81 MG tablet Take 81 mg by mouth daily.     atorvastatin (LIPITOR) 20 MG tablet Take 20 mg by mouth daily.     Calcium Carb-Cholecalciferol (CALCIUM 500 + D3 PO) Take 1 capsule by mouth daily.     folic acid (FOLVITE) 034 MCG tablet Take 400 mcg by mouth daily.  oxybutynin (DITROPAN-XL) 10 MG 24 hr tablet Take 10 mg by mouth daily in the afternoon.     pantoprazole (PROTONIX) 40 MG tablet Take 40 mg by mouth daily.     polyethylene glycol (MIRALAX / GLYCOLAX) 17 g packet Take 17 g by mouth as needed.     vitamin B-12 (CYANOCOBALAMIN) 500 MCG tablet Take 500 mcg by mouth every other day.      VITAMIN D PO Take 400 Units by mouth daily.     famotidine (PEPCID) 20 MG tablet Take 20 mg by mouth as needed.  (Patient not taking: Reported on 11/28/2021)     No current facility-administered medications for this visit.   Allergies  Allergen Reactions   Penicillins Rash     Review of Systems: All systems reviewed and negative except where noted in HPI.   Physical Exam: BP 126/76   Pulse 75   Ht '5\' 5"'$  (1.651 m)   Wt 128 lb (58.1 kg)   BMI 21.30 kg/m  Constitutional: Pleasant,well-developed, female in no acute distress. Neurological: Alert and oriented to person place and time. Psychiatric: Normal mood and affect. Behavior is  normal.   ASSESSMENT: 66 y.o. female here for assessment of the following  1. Pancreatic cyst   2. Constipation, unspecified constipation type   3. Gastroesophageal reflux disease, unspecified whether esophagitis present    Reviewed her history of pancreatic cysts and management of these in general.  Her MRCP has showed stable changes, there is no high risk features at this point time that would warrant EUS based on prior review with my advanced endoscopy colleagues.  Its been about 1 year since her last MRCP and think it is reasonable to repeat an MRCP at this point time.  If this continues to show stable changes we may extend her surveillance to 2 years.  She is agreeable to proceed with MRCP at this point time, reserving EUS for change in size or development of high risk features.  Reviewed her altered bowel habits which appear to be due to oxybutynin.  Recommend MiraLAX daily and titrate up or down as needed to maintain routine/regular bowel movements.  She has not used it regularly at all and I think this would work better for her, do not think we need to escalate therapy beyond this at least now.  Reviewed her history of reflux, had previously recommended Pepcid as needed, she has not really been using that and just tried Protonix 40 mg daily and it certainly works.  We discussed long-term regimens for this, PPIs versus H2 blockers.  I think if she is using it for mild intermittent symptoms she may be better off with Pepcid.  She will try reducing Protonix to 20 mg daily, and can use that if symptoms persist despite Pepcid.  I do not think she needs an endoscopy at this point time, no alarm symptoms, otherwise feeling well.   PLAN: - MRCP for evaluation of pancreatic cyst. Have discussed with advanced endoscopy last year, has not changed, holding off on EUS. She is asymptomatic - Miralax daily and titrate up or down as needed - reduce protonix to '20mg'$  / day, use that PRN or pepcid PRN for  mild intermittent symptoms. Discussed long term risks of chronic PPIs, for longer term use recommend pepcid if it works okay for her.  Jolly Mango, MD [facility]

## 2021-12-18 ENCOUNTER — Other Ambulatory Visit: Payer: Self-pay | Admitting: Gastroenterology

## 2021-12-18 ENCOUNTER — Ambulatory Visit (HOSPITAL_COMMUNITY)
Admission: RE | Admit: 2021-12-18 | Discharge: 2021-12-18 | Disposition: A | Discharge status: Home / Self Care | Payer: Medicare Other | Source: Ambulatory Visit | Attending: Gastroenterology | Admitting: Gastroenterology

## 2021-12-18 DIAGNOSIS — K862 Cyst of pancreas: Secondary | ICD-10-CM | POA: Insufficient documentation

## 2021-12-18 MED ORDER — GADOBUTROL 1 MMOL/ML IV SOLN
6.0000 mL | Freq: Once | INTRAVENOUS | Status: AC | PRN
  Administered 2021-12-18: 6 mL via INTRAVENOUS

## 2022-08-29 ENCOUNTER — Encounter: Payer: Self-pay | Admitting: Gastroenterology

## 2023-01-07 ENCOUNTER — Encounter: Payer: Self-pay | Admitting: Gastroenterology

## 2023-01-07 ENCOUNTER — Ambulatory Visit (INDEPENDENT_AMBULATORY_CARE_PROVIDER_SITE_OTHER): Payer: Medicare Other | Admitting: Gastroenterology

## 2023-01-07 VITALS — BP 110/70 | HR 98 | Ht 65.0 in | Wt 129.0 lb

## 2023-01-07 DIAGNOSIS — K219 Gastro-esophageal reflux disease without esophagitis: Secondary | ICD-10-CM

## 2023-01-07 DIAGNOSIS — K859 Acute pancreatitis without necrosis or infection, unspecified: Secondary | ICD-10-CM

## 2023-01-07 DIAGNOSIS — K862 Cyst of pancreas: Secondary | ICD-10-CM

## 2023-01-07 DIAGNOSIS — K5900 Constipation, unspecified: Secondary | ICD-10-CM | POA: Diagnosis not present

## 2023-01-07 NOTE — Patient Instructions (Signed)
You have been scheduled for an MRI/MRCP at [facility] on 01/10/2023. Your appointment time is 7:00AM. Please arrive to admitting (at main entrance of the hospital) 30 minutes prior to your appointment time for registration purposes. Please make certain not to have anything to eat or drink 6 hours prior to your test. In addition, if you have any metal in your body, have a pacemaker or defibrillator, please be sure to let your ordering physician know. This test typically takes 45 minutes to 1 hour to complete. Should you need to reschedule, please call [phone] to do so.  Continue your famotidine  _______________________________________________________  If your blood pressure at your visit was 140/90 or greater, please contact your primary care physician to follow up on this.  _______________________________________________________  If you are age 11 or older, your body mass index should be between 23-30. Your Body mass index is 21.47 kg/m. If this is out of the aforementioned range listed, please consider follow up with your Primary Care Provider.  If you are age 68 or younger, your body mass index should be between 19-25. Your Body mass index is 21.47 kg/m. If this is out of the aformentioned range listed, please consider follow up with your Primary Care Provider.   ________________________________________________________  The  GI providers would like to encourage you to use [facility] to communicate with providers for non-urgent requests or questions.  Due to long hold times on the telephone, sending your provider a message by [facility] may be a faster and more efficient way to get a response.  Please allow 48 business hours for a response.  Please remember that this is for non-urgent requests.  _______________________________________________________ Thank you for entrusting me with your care and for choosing [facility], Dr. Ileene Patrick

## 2023-01-07 NOTE — Progress Notes (Signed)
HPI :  67 year old female here for follow-up visit for pancreatic cyst and GERD, history of constipation.  I last saw her about a year ago.   Recall that the patient underwent an ultrasound of her abdomen for aortic aneurysm screening on November 2020.  There was no evidence of an aneurysm however there was a short segment of echogenicity within the lower abdominal aorta with lack of color Doppler signal and was concerning for possible thrombus.  This led to CT angiogram of the abdomen and pelvis on January 25 2019 in [place]. The CT showed that along the deep portion of the uncinate process of the pancreas, a 1.6, by 2.3 x 1.6 cm low attenuating nodular focus was appreciated with a Hounsfield unit of 11.86.  This was suspected to represent a cyst.  The pancreas was otherwise unremarkable without any lymphadenopathy or other concerning findings.    Since that time she has had surveillance MRCP to evaluate pancreatic cyst.  I previously had discussed her case with my advanced endoscopy colleagues regarding EUS versus MRCP and they recommended continued MRCP surveillance.  Recall she has no family history of pancreatic cancer or pancreatitis.  She has no history of tobacco use.  She been feeling well since have last seen her.  She has no complaints really today that bother her.  Her last MRCP was in October 2023.  The cyst was reported to be 2.1 cm at that time without any concerning interval changes.  Radiology recommended repeat exam in 1 year.  She does have a history of reflux that is bothered her in recent years.  Had been on Nexium remotely for some time, came off of it and had recurrence of symptoms.  She was placed back on Protonix 40 mg daily at the last time when I saw her and that had been working pretty well for her reflux symptoms in general.  We had discussed long-term risks of chronic PPIs and try to switch her to a different class of medication.  She was transition to famotidine 20 mg  daily.  She states this worked pretty well for her in general.  She takes it every day and for the most part it controls her symptoms.  If she has any trigger foods such as tomato-based sauces, chocolate etc., she can have some nocturnal symptoms but generally she perhaps has breakthrough once every 2 weeks or 2 times per month.  She drinks some water when this bothers her and it takes the symptoms away.  No dysphagia.  No nausea or vomiting, she is eating well.  We otherwise discussed her bowels at the last visit, she had some constipation in the setting of oxybutynin.  She was on MiraLAX for a bit of time and then tapered off and now managing her bowels with high-fiber diet and this is working pretty well for her.  She denies any problems with her bowels at this time.   Prior workup: CT chest / abdomen / pelvis 01/25/2019 - 1.6 x 2.3 x 1.6cm nodular focus of the uncinate process , likely represents a cyst.   MRCP 03/25/19 -  IMPRESSION: 1. 2.7 by 0.8 by 2.4 cm serpentine cystic lesion or clustered cystic lesion along the posterior margin of the pancreatic head and uncinate process. Based on configuration the top considerations would be intraductal papillary mucinous neoplasm or serous cystadenoma. Based on the long axis size of the lesion being greater than 2.5 cm, reasonable follow up at this point might include endoscopic  ultrasound with fine-needle aspiration, or follow up pancreatic protocol MRI in 6 months time to assess for change. This recommendation follows ACR consensus guidelines: Management of Incidental Pancreatic Cysts: A White Paper of the ACR Incidental Findings Committee. J Am Coll Radiol 2017;14:911-923. 2. Aortic Atherosclerosis (ICD10-I70.0).   MRCP 09/26/19 - IMPRESSION: 1. Multi cystic lesion in the pancreatic head is unchanged since previous imaging. Size greater than 2.5 cm, at this size would suggest another six-month follow-up to assess for stability. Differential is  unchanged with serous cystadenoma or small IPMN but without high-risk features of main duct dilation. 2. No significant fat or iron in the liver. Small hepatic cysts are unchanged. 3. Atheromatous plaque in the abdominal aorta.     MRCP 04/28/20 - IMPRESSION: Unchanged size in the multi-cystic pancreatic lesion located behind the uncinate process, which is without high risk imaging features now with 1 year of stability by imaging. Differential diagnosis for this lesion remains the same with side branch intraductal papillary mucinous neoplasm (IPMN) or serous cystadenoma being the primary considerations. Recommend follow up pre and post contrast MRI/MRCP or pancreatic protocol CT in 6 months. This recommendation follows ACR consensus guidelines: Management of Incidental Pancreatic Cysts: A White Paper of the ACR Incidental Findings Committee. J Am Coll Radiol 2017;14:911-923.   MRCP 12/14/20 - IMPRESSION: Unchanged flattened, thinly septated cystic lesion of the posterior pancreatic head and uncinate measuring 2.5 x 0.8 cm. As on prior examination, primary differential considerations remain IPMN and serous cystic neoplasm. Recommend follow up pre and post contrast MRI/MRCP or pancreatic protocol CT in 6 months. EUS/FNA can be considered for definitive diagnosis. This recommendation follows ACR consensus guidelines: Management of Incidental Pancreatic Cysts: a White Paper of the ACR Incidental Findings Committee. J Am Coll Radiol 2017;14:911-923.   MRCP 12/18/21: IMPRESSION: Similar size of the flattened multi cystic pancreatic 2.1 x 1.1 cm lesion in the uncinate process without suspicious MRI features, primary differential considerations include a side branch IPMN versus serous cystic neoplasm. Recommend follow up pre and post contrast MRI/MRCP or pancreatic protocol CT in 1 year. This recommendation follows ACR consensus guidelines: Management of Incidental Pancreatic Cysts: A  White Paper of the ACR Incidental Findings Committee. J Am Coll Radiol 2017;14:911-923.     Colonoscopy 09/26/2014 - tortous colon, otherwise normal, no polyps - Dr. Allena Katz, Octavio Manns       Past Medical History:  Diagnosis Date   Dysplastic nevus of skin    GERD (gastroesophageal reflux disease)    Hyperglycemia    Hyperlipidemia    Overactive bladder    Pancreatic cyst    Rosacea    Urticaria      Past Surgical History:  Procedure Laterality Date   bartholin cyst removal     THYROID SURGERY     glossal duct cyst   Family History  Problem Relation Age of Onset   Cirrhosis Mother    Hypertension Mother    Diabetes type II Mother    Hypothyroidism Mother    Myasthenia gravis Mother    Heart attack Father    Cirrhosis Sister        hepatitis   Colon cancer Neg Hx    Liver cancer Neg Hx    Rectal cancer Neg Hx    Stomach cancer Neg Hx    Social History   Tobacco Use   Smoking status: Never   Smokeless tobacco: Never  Vaping Use   Vaping status: Never Used  Substance Use Topics   Alcohol use:  Not Currently    Alcohol/week: 1.0 standard drink of alcohol    Types: 1 Cans of beer per week    Comment: 1 beer  weekly   Drug use: Never   Current Outpatient Medications  Medication Sig Dispense Refill   aspirin EC 81 MG tablet Take 81 mg by mouth daily.     atorvastatin (LIPITOR) 20 MG tablet Take 20 mg by mouth daily.     Calcium Carb-Cholecalciferol (CALCIUM 500 + D3 PO) Take 1 capsule by mouth daily.     famotidine (PEPCID) 20 MG tablet Take 20 mg by mouth as needed.     folic acid (FOLVITE) 800 MCG tablet Take 400 mcg by mouth daily.     oxybutynin (DITROPAN-XL) 10 MG 24 hr tablet Take 10 mg by mouth daily in the afternoon.     vitamin B-12 (CYANOCOBALAMIN) 500 MCG tablet Take 500 mcg by mouth every other day.      VITAMIN D PO Take 400 Units by mouth daily.     pantoprazole (PROTONIX) 40 MG tablet Take 20 mg by mouth daily.     polyethylene glycol (MIRALAX /  GLYCOLAX) 17 g packet Take 17 g by mouth as needed. (Patient not taking: Reported on 01/07/2023)     No current facility-administered medications for this visit.   Allergies  Allergen Reactions   Penicillins Rash     Review of Systems: All systems reviewed and negative except where noted in HPI.   Physical Exam: BP 110/70   Pulse 98   Ht 5\' 5"  (1.651 m)   Wt 129 lb (58.5 kg)   BMI 21.47 kg/m  Constitutional: Pleasant,well-developed, female in no acute distress. Neurological: Alert and oriented to person place and time. Psychiatric: Normal mood and affect. Behavior is normal.   ASSESSMENT: 67 y.o. female here for assessment of the following  1. Pancreatic cyst   2. Gastroesophageal reflux disease, unspecified whether esophagitis present   3. Constipation, unspecified constipation type    Pancreatic cysts noted incidentally on CT scan done for other reasons a few years ago.  This has been surveyed with MRCP over time and appears stable.  I have previously discussed her case with my advanced endoscopy colleagues in regards to possible EUS versus continued MRCP surveillance, they have favored MRCP.  Her last exam was a year ago and had stable changes.  We discussed pancreatic cyst/IPMN, potential risk for pancreatic cancer over time however that risk is thought to be quite low especially in cysts with stability over time.  Recommended follow-up MRCP at this time to reassess her pancreas.  She agrees and wishes to proceed with this at this time.  Otherwise reviewed her history of reflux, has transitioned off PPI and on famotidine monotherapy with pretty good results.  Has occasional breakthrough depending on dietary indiscretion.  She can use Tums or Maalox as needed for breakthrough.  If she has more frequent symptoms she can resume PPI if that works better for her however she prefers to continue famotidine given symptoms are generally well-controlled.  She has had symptoms for several  years now, at her age with chronicity of symptoms I offered her screening endoscopy for Barrett's although she does not have any risk factors or alarm symptoms otherwise.  We discussed this for a bit, she does not wish to proceed with EGD at this time but will let me know if she changes her mind.  Otherwise controlling her constipation with dietary changes, can use MiraLAX as needed  in the future if needed.  Colonoscopy up-to-date.  PLAN: - schedule MRCP to evaluate pancreatic cyst - continue famotidine 20mg  / day for GERD - can resume protonix if / when needed PRN - discussed screening EGD, she declines for now - managing constipation with diet - f/u one year or sooner with issues  Harlin Rain, MD [facility]

## 2023-01-10 ENCOUNTER — Ambulatory Visit (HOSPITAL_COMMUNITY)
Admission: RE | Admit: 2023-01-10 | Discharge: 2023-01-10 | Disposition: A | Discharge status: Home / Self Care | Payer: Medicare Other | Source: Ambulatory Visit | Attending: Gastroenterology | Admitting: Gastroenterology

## 2023-01-10 ENCOUNTER — Other Ambulatory Visit: Payer: Self-pay | Admitting: Gastroenterology

## 2023-01-10 DIAGNOSIS — K859 Acute pancreatitis without necrosis or infection, unspecified: Secondary | ICD-10-CM | POA: Diagnosis present

## 2023-01-10 MED ORDER — GADOBUTROL 1 MMOL/ML IV SOLN
5.0000 mL | Freq: Once | INTRAVENOUS | Status: AC | PRN
  Administered 2023-01-10: 5 mL via INTRAVENOUS

## 2023-12-30 ENCOUNTER — Ambulatory Visit (INDEPENDENT_AMBULATORY_CARE_PROVIDER_SITE_OTHER): Admitting: Gastroenterology

## 2023-12-30 ENCOUNTER — Encounter: Payer: Self-pay | Admitting: Gastroenterology

## 2023-12-30 VITALS — BP 122/70 | HR 69 | Ht 65.0 in | Wt 132.0 lb

## 2023-12-30 DIAGNOSIS — K5900 Constipation, unspecified: Secondary | ICD-10-CM | POA: Diagnosis not present

## 2023-12-30 DIAGNOSIS — Z1211 Encounter for screening for malignant neoplasm of colon: Secondary | ICD-10-CM

## 2023-12-30 DIAGNOSIS — K219 Gastro-esophageal reflux disease without esophagitis: Secondary | ICD-10-CM

## 2023-12-30 DIAGNOSIS — K862 Cyst of pancreas: Secondary | ICD-10-CM | POA: Diagnosis not present

## 2023-12-30 NOTE — Progress Notes (Signed)
 HPI :  68 year old female here for a follow-up visit for pancreatic cyst, GERD, constipation.  I saw her last in November 2024.  I have followed her in recent years for pancreatic cyst. Recall that the patient underwent an ultrasound of her abdomen for aortic aneurysm screening on November 2020.  There was no evidence of an aneurysm however there was a short segment of echogenicity within the lower abdominal aorta with lack of color Doppler signal and was concerning for possible thrombus.  This led to CT angiogram of the abdomen and pelvis on January 25 2019 in [place]. The CT showed that along the deep portion of the uncinate process of the pancreas, a 1.6, by 2.3 x 1.6 cm low attenuating nodular focus was appreciated with a Hounsfield unit of 11.86.  This was suspected to represent a cyst.  The pancreas was otherwise unremarkable without any lymphadenopathy or other concerning findings.     Since that time she has had surveillance MRCP to evaluate pancreatic cyst.  I previously had discussed her case with my advanced endoscopy colleagues regarding EUS versus MRCP and they recommended continued MRCP surveillance.  Recall she has no family history of pancreatic cancer or pancreatitis.  She has no history of tobacco use.  She has had a few MRIs now over recent years.  Her last MRCP was 1 year ago.  Remarkable findings is that this is a complex multiloculated cyst measuring 2.8 cm in largest diameter.  It is unchanged dating back to January 2021.  I again discussed her case with my advanced endoscopy colleagues about possible EUS and given stability over years it was recommended to continue to survey with MRI.    She is due for MRCP at this time and wishes to schedule.  She denies any abdominal pain.  No weight loss.  Feeling quite well.  In regards to her reflux recall she has been on PPI in the past however over time we have been able to get her to be maintained on Pepcid.  Over the past few years  this has worked pretty well.  She is currently taking it every day.  She has found that diet tends to drive her reflux symptoms-eating tomato-based foods, chocolate, cheese, onions, garlic can precipitate her reflux.  If she avoids these she really does not have problems.  She has been pretty compliant about avoiding trigger foods.  She wonders if she can taper down off the Pepcid.  Recall she has never had an EGD.  She denies any dysphagia.  I have offered an EGD for Barrett screening in the past which she has not wanted to pursue.  Otherwise we have had her on MiraLAX for her constipation and she states this continues to work pretty well.  She takes it as needed.  She tries to eat a lot of fruits and oatmeal in the morning and this typically works pretty well for her.  Her last colonoscopy was in 2016 and she is due for a screening exam next year, in August.    Prior workup: CT chest / abdomen / pelvis 01/25/2019 - 1.6 x 2.3 x 1.6cm nodular focus of the uncinate process , likely represents a cyst.   MRCP 03/25/19 -  IMPRESSION: 1. 2.7 by 0.8 by 2.4 cm serpentine cystic lesion or clustered cystic lesion along the posterior margin of the pancreatic head and uncinate process. Based on configuration the top considerations would be intraductal papillary mucinous neoplasm or serous cystadenoma. Based on the long  axis size of the lesion being greater than 2.5 cm, reasonable follow up at this point might include endoscopic ultrasound with fine-needle aspiration, or follow up pancreatic protocol MRI in 6 months time to assess for change. This recommendation follows ACR consensus guidelines: Management of Incidental Pancreatic Cysts: A White Paper of the ACR Incidental Findings Committee. J Am Coll Radiol 2017;14:911-923. 2. Aortic Atherosclerosis (ICD10-I70.0).   MRCP 09/26/19 - IMPRESSION: 1. Multi cystic lesion in the pancreatic head is unchanged since previous imaging. Size greater than 2.5 cm, at  this size would suggest another six-month follow-up to assess for stability. Differential is unchanged with serous cystadenoma or small IPMN but without high-risk features of main duct dilation. 2. No significant fat or iron in the liver. Small hepatic cysts are unchanged. 3. Atheromatous plaque in the abdominal aorta.     MRCP 04/28/20 - IMPRESSION: Unchanged size in the multi-cystic pancreatic lesion located behind the uncinate process, which is without high risk imaging features now with 1 year of stability by imaging. Differential diagnosis for this lesion remains the same with side branch intraductal papillary mucinous neoplasm (IPMN) or serous cystadenoma being the primary considerations. Recommend follow up pre and post contrast MRI/MRCP or pancreatic protocol CT in 6 months. This recommendation follows ACR consensus guidelines: Management of Incidental Pancreatic Cysts: A White Paper of the ACR Incidental Findings Committee. J Am Coll Radiol 2017;14:911-923.   MRCP 12/14/20 - IMPRESSION: Unchanged flattened, thinly septated cystic lesion of the posterior pancreatic head and uncinate measuring 2.5 x 0.8 cm. As on prior examination, primary differential considerations remain IPMN and serous cystic neoplasm. Recommend follow up pre and post contrast MRI/MRCP or pancreatic protocol CT in 6 months. EUS/FNA can be considered for definitive diagnosis. This recommendation follows ACR consensus guidelines: Management of Incidental Pancreatic Cysts: a White Paper of the ACR Incidental Findings Committee. J Am Coll Radiol 2017;14:911-923.   MRCP 12/18/21: IMPRESSION: Similar size of the flattened multi cystic pancreatic 2.1 x 1.1 cm lesion in the uncinate process without suspicious MRI features, primary differential considerations include a side branch IPMN versus serous cystic neoplasm. Recommend follow up pre and post contrast MRI/MRCP or pancreatic protocol CT in 1 year.  This recommendation follows ACR consensus guidelines: Management of Incidental Pancreatic Cysts: A White Paper of the ACR Incidental Findings Committee. J Am Coll Radiol 2017;14:911-923.       Colonoscopy 09/26/2014 - tortous colon, otherwise normal, no polyps - Dr. Tobie, [facility]    MRCP 01/10/23: IMPRESSION: 1. Complex, multiloculated fluid signal cystic lesion within the dorsal pancreatic head is not significantly changed on examinations dating back to 03/25/2019, measuring 2.8 x 2.2 x 1.3 cm. No solid component or suspicious contrast enhancement. No pancreatic ductal dilatation or surrounding inflammatory changes. Findings are most consistent with a complex IPMN or other cystic pancreatic neoplasm. Given size greater than 2.5 cm, EUS/FNA can be considered, however given well established imaging stability at this time, an additional follow-up in 1-2 years to establish at least 5 years of imaging stability and definitively benign nature is also an option. 2. No acute findings of the abdomen.   Past Medical History:  Diagnosis Date   Dysplastic nevus of skin    GERD (gastroesophageal reflux disease)    Hyperglycemia    Hyperlipidemia    Osteopenia    Overactive bladder    Pancreatic cyst    Rosacea    Urticaria      Past Surgical History:  Procedure Laterality Date   bartholin cyst  removal     THYROID SURGERY     glossal duct cyst   Family History  Problem Relation Age of Onset   Cirrhosis Mother    Hypertension Mother    Diabetes type II Mother    Hypothyroidism Mother    Myasthenia gravis Mother    Heart attack Father    Cirrhosis Sister        hepatitis   Colon cancer Neg Hx    Liver cancer Neg Hx    Rectal cancer Neg Hx    Stomach cancer Neg Hx    Social History   Tobacco Use   Smoking status: Never   Smokeless tobacco: Never  Vaping Use   Vaping status: Never Used  Substance Use Topics   Alcohol use: Not Currently    Alcohol/week: 1.0 standard  drink of alcohol    Types: 1 Cans of beer per week    Comment: 1 beer  weekly   Drug use: Never   Current Outpatient Medications  Medication Sig Dispense Refill   aspirin EC 81 MG tablet Take 81 mg by mouth daily.     atorvastatin (LIPITOR) 20 MG tablet Take 20 mg by mouth daily.     Calcium Carb-Cholecalciferol (CALCIUM 500 + D3 PO) Take 1 capsule by mouth daily.     famotidine (PEPCID) 20 MG tablet Take 20 mg by mouth as needed.     folic acid (FOLVITE) 800 MCG tablet Take 400 mcg by mouth daily.     oxybutynin (DITROPAN-XL) 10 MG 24 hr tablet Take 10 mg by mouth daily in the afternoon.     polyethylene glycol (MIRALAX / GLYCOLAX) 17 g packet Take 17 g by mouth as needed.     vitamin B-12 (CYANOCOBALAMIN) 500 MCG tablet Take 500 mcg by mouth every other day.      No current facility-administered medications for this visit.   Allergies  Allergen Reactions   Penicillins Rash     Review of Systems: All systems reviewed and negative except where noted in HPI.    Physical Exam: BP 122/70   Pulse 69   Ht 5' 5 (1.651 m)   Wt 132 lb (59.9 kg)   BMI 21.97 kg/m  Constitutional: Pleasant,well-developed, female in no acute distress. Neurological: Alert and oriented to person place and time. Psychiatric: Normal mood and affect. Behavior is normal.   ASSESSMENT: 68 y.o. female here for assessment of the following  1. Pancreatic cyst   2. Gastroesophageal reflux disease, unspecified whether esophagitis present   3. Constipation, unspecified constipation type   4. Colon cancer screening    Reviewed her history of pancreatic cyst.  This has been present for a few years now and stable on imaging at least since 2021.  I have discussed her case with my advanced endoscopy colleagues, she is asymptomatic from this.  Given stability and imaging over time they have recommended continued MRCP surveillance and holding off on EUS.  She is due for MRCP at this time.  She wishes to proceed.  If  this is stable over 5 years we will discussed how frequent to serve her this and when to stop.  Hopefully no interval changes. Will contact her with results with further recommendations.  Otherwise, longstanding reflux, previously on PPI and has weaned off to Pepcid.  Now pretty well-controlled with Pepcid and avoiding clear dietary triggers and doing quite well.  We discussed over time using the lowest daily dose needed to control symptoms.  She will  try backing off Pepcid to every other day dosing and can increase as needed.  I did offer her screening EGD for Barrett's screening at some point if she wanted to pursue that.  She has not wanted to do that historically but may consider doing it at the time of her colonoscopy next year.  She will let me know prior to scheduling that exam.  MiraLAX working well for constipation, continue that for now as needed and high-fiber diet.  Recall colonoscopy next August for routine colon cancer screening.  PLAN: - schedule MRCP - continue pepcid PRN, will try to wean. Continue dietary measures - offered EGD for BE screening, she will consider it at time of colonoscopy next year - continue Miralax PRN - colonoscopy 09/2024  Marcey Naval, MD [facility]

## 2023-12-30 NOTE — Patient Instructions (Signed)
 You will be due for a recall colonoscopy in August of 2026. We will send you a reminder in the mail when it gets closer to that time.  You will be contacted by [facility] in the next 2 days to arrange a MRCP.  The number on your caller ID will be [phone], please answer when they call.  If you have not heard from them in 2 days please call [phone] to schedule.     Thank you for entrusting me with your care and for choosing [facility], Dr. Elspeth Naval    _______________________________________________________  If your blood pressure at your visit was 140/90 or greater, please contact your primary care physician to follow up on this.  _______________________________________________________  If you are age 71 or older, your body mass index should be between 23-30. Your Body mass index is 21.97 kg/m. If this is out of the aforementioned range listed, please consider follow up with your Primary Care Provider.  If you are age 27 or younger, your body mass index should be between 19-25. Your Body mass index is 21.97 kg/m. If this is out of the aformentioned range listed, please consider follow up with your Primary Care Provider.   ________________________________________________________  The  GI providers would like to encourage you to use MYCHART to communicate with providers for non-urgent requests or questions.  Due to long hold times on the telephone, sending your provider a message by [facility] may be a faster and more efficient way to get a response.  Please allow 48 business hours for a response.  Please remember that this is for non-urgent requests.  _______________________________________________________  [facility] is using a team-based approach to care.  Your team is made up of your doctor and two to three APPS. Our APPS (Nurse Practitioners and Physician Assistants) work with your physician to ensure care continuity for you.  They are fully qualified to address your health concerns and develop a treatment plan. They communicate directly with your gastroenterologist to care for you. Seeing the Advanced Practice Practitioners on your physician's team can help you by facilitating care more promptly, often allowing for earlier appointments, access to diagnostic testing, procedures, and other specialty referrals.

## 2024-01-15 ENCOUNTER — Ambulatory Visit (HOSPITAL_COMMUNITY)
Admission: RE | Admit: 2024-01-15 | Discharge: 2024-01-15 | Disposition: A | Discharge status: Home / Self Care | Source: Ambulatory Visit | Attending: Gastroenterology | Admitting: Gastroenterology

## 2024-01-15 ENCOUNTER — Other Ambulatory Visit: Payer: Self-pay | Admitting: Gastroenterology

## 2024-01-15 DIAGNOSIS — K862 Cyst of pancreas: Secondary | ICD-10-CM

## 2024-01-15 MED ORDER — GADOBUTROL 1 MMOL/ML IV SOLN
6.0000 mL | Freq: Once | INTRAVENOUS | Status: AC | PRN
  Administered 2024-01-15: 6 mL via INTRAVENOUS

## 2024-01-23 ENCOUNTER — Ambulatory Visit: Payer: Self-pay | Admitting: Gastroenterology
